# Patient Record
Sex: Female | Born: 1990 | Race: White | Hispanic: No | Marital: Single | Smoking: Current every day smoker
Health system: Southern US, Community
[De-identification: ages and names within clinical notes are randomized; demographics above are authoritative.]

## PROBLEM LIST (undated history)

## (undated) DIAGNOSIS — Q780 Osteogenesis imperfecta: Secondary | ICD-10-CM

## (undated) HISTORY — PX: MANDIBLE SURGERY: SHX707

## (undated) HISTORY — PX: ANKLE SURGERY: SHX546

---
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
## 2008-07-13 ENCOUNTER — Encounter: Payer: Self-pay | Admitting: *Deleted

## 2008-07-17 ENCOUNTER — Ambulatory Visit: Payer: Self-pay | Admitting: Family Medicine

## 2009-01-30 ENCOUNTER — Emergency Department (HOSPITAL_COMMUNITY): Admission: EM | Admit: 2009-01-30 | Discharge: 2009-01-31 | Payer: Self-pay | Admitting: Emergency Medicine

## 2010-01-02 ENCOUNTER — Emergency Department (HOSPITAL_COMMUNITY): Admission: EM | Admit: 2010-01-02 | Discharge: 2010-01-02 | Payer: Self-pay | Admitting: Emergency Medicine

## 2010-01-07 ENCOUNTER — Encounter: Payer: Self-pay | Admitting: Family Medicine

## 2010-01-07 ENCOUNTER — Ambulatory Visit: Payer: Self-pay | Admitting: Family Medicine

## 2010-01-07 LAB — CONVERTED CEMR LAB
TSH: 1.21 microintl units/mL (ref 0.350–4.500)
Vit D, 25-Hydroxy: 22 ng/mL — ABNORMAL LOW (ref 30–89)

## 2010-02-24 ENCOUNTER — Ambulatory Visit: Payer: Self-pay | Admitting: Family Medicine

## 2010-02-24 DIAGNOSIS — N926 Irregular menstruation, unspecified: Secondary | ICD-10-CM

## 2010-02-24 LAB — CONVERTED CEMR LAB: Beta hcg, urine, semiquantitative: NEGATIVE

## 2010-03-08 ENCOUNTER — Encounter: Payer: Self-pay | Admitting: Family Medicine

## 2010-03-08 LAB — CONVERTED CEMR LAB: Pap Smear: NEGATIVE

## 2010-05-19 ENCOUNTER — Ambulatory Visit: Payer: Self-pay | Admitting: Family Medicine

## 2010-08-12 ENCOUNTER — Emergency Department (HOSPITAL_COMMUNITY): Admission: EM | Admit: 2010-08-12 | Discharge: 2010-08-12 | Payer: Self-pay | Admitting: Family Medicine

## 2010-10-27 NOTE — Assessment & Plan Note (Signed)
Summary: cpe & preg test,df   Vital Signs:  Patient profile:   20 year old female Height:      65 inches Weight:      150 pounds BMI:     25.05 Temp:     97.6 degrees F oral Pulse rate:   76 / minute BP sitting:   114 / 76  (left arm) Cuff size:   regular  Vitals Entered By: Garen Grams LPN (February 24, 2129 8:46 AM) CC: cpe Is Patient Diabetic? No Pain Assessment Patient in pain? no        Primary Care Provider:  Bobby Rumpf  MD  CC:  cpe.  History of Present Illness: 1) Anxiety: Seen at [facility] on 01/07/10 w/ concerns about recurrence of MDD/anxiety. Diagnosed at 20 years old. Had been on Prozac in the past; was restarted on Prozac at last visit, but reports that she only took it for a month. Reports that she was having "paranoia" related to marijauna use - as soon as she stopped her marijuana use her symptoms of anxiety resolved, therefore she did not need the Prozac any more. Reports sleep has improved, she feels motivated, and overall mood is "great". Reports that she does not plan on using marijuana in the future. Denies SI/HI.   2) Tobacco use: Smokes 1/2 pack per day but wishes to quit. Denies chronic cough, dyspnea.   3) Irregular menses: History of irregular menses. LMP was 1.5 months ago (the longest she has gone without a period is three months. Periods last 7 to 14 days and vary in terms of heaviness. Has regular, unprotected intercourse in monogamous relationship. Would like pregnancy test today to ensure she is not pregnant. Reports some bleeding yesterday and some spotting today.   4) Birth control: Would like to restart birth control. Sexual intercourse as above. Considering Depo Provera (has been on this in the past - with some mild acne and weight gain) vs. OCPs. Current smoker.   5) Prevention: Pap today. Counseled on tobacco use.   Habits & Providers  Alcohol-Tobacco-Diet     Tobacco Status: current     Tobacco Counseling: to quit use of tobacco products  Cigarette Packs/Day: 0.5  Current Medications (verified): 1)  None  Allergies (verified): No Known Drug Allergies  Past History:  Past Surgical History: R ankle.   Social History: Lives with mom, dad, and brother.  Stopped marijauna about 1 month ago - no ther drug use. Occasional alcohol (4 drinks a month)  w/o binge drinking. Delivers newspapers. Monogamous relationship, unprotected intercourse. Packs/Day:  0.5  Review of Systems       as per HPI o/w negative for 10 systems   Physical Exam  General:  overweight, NAD, pleasant, vitals reviewed  Eyes:  pupils equal, round and reactive to light , extraoccular movements intact , fundi normal  Nose:  no congestion or rhinorrhea.  Mouth:  moist membranes, no erythema or exudate bilateral tonsillar hypertrophy w/o erythema or exudate noted.  Neck:  no lymphadenopathy   Lungs:  CTAB w/o wheeze or crackles  Heart:  RRR no murmurs .  Abdomen:  soft, non-tender, normal bowel sounds, no distention, no masses, and no guarding.   Genitalia:  Pelvic Exam:        External: normal female genitalia without lesions or masses        Vagina: normal without lesions or masses        Cervix: normal without lesions or masses  Adnexa: normal bimanual exam without masses or fullness        Uterus: normal by palpation        Pap smear: performed Msk:  normal ROM, no joint tenderness, no joint swelling, and no joint warmth. Pulses:  2+ radials  Extremities:  no edema  Neurologic:  alert & oriented X3, cranial nerves II-XII intact, strength normal in all extremities, sensation intact to light touch, and DTRs symmetrical and normal.   Skin:  mild facial acne    Impression & Recommendations:  Problem # 1:  Preventive Health Care (ICD-V70.0) Assessment Unchanged  Pap today. Counseled on tobacco use.   Orders: [facility] - Est  18-39 yrs ([address]109)  Problem # 2:  DEPRESSION (ICD-311) Assessment: Improved Advised to stop using marijauna as before  - patient agreeable to this plan. Does not wish to restart anti-depressants. Will follow up if symptoms return.  The following medications were removed from the medication list:    Fluoxetine Hcl 20 Mg Caps (Fluoxetine hcl) ..... One tablet by mouth daily  Problem # 3:  IRREGULAR MENSTRUAL CYCLE (ICD-626.4) Assessment: Unchanged Upreg negative. No change from prior cycles. Will follow for now. Advised diet and exercise for weight loss, may help regulate periods. Depo Provera as below may result in amenorrhea (advised patient as such).   Problem # 4:  CONTRACEPTIVE MANAGEMENT (ICD-V25.09) Assessment: Comment Only  Depo Provera. Will follow in three months for next shot.   Orders: U Preg-[facility] (60454) Depo-Provera 150mg  (J1055) Pap Smear-[facility] (09811-91478)  Complete Medication List: 1)  Oscal 500/200 D-3 500-200 Mg-unit Tabs (Calcium-vitamin d) .... One tab by mouth qday  Patient Instructions: 1)  Next Depo Provera shot due Aug 18th - Sept 1st  2)  Continue to work on quitting smoking. Have 10 cigarettes for three days, then 9 for three days, then 8 and so on until you are quit. CALL the quitline for help with quitting! 3)  I will let you know the results of your Pap.  4)  Start taking Oscal tabs (calcium and vitamin D). This will help build up your bones.  5)  Follow up in one year for physical and Pap.  Prescriptions: OSCAL 500/200 D-3 500-200 MG-UNIT TABS (CALCIUM-VITAMIN D) one tab by mouth qday  #30 x 12   Entered and Authorized by:   Bobby Rumpf  MD   Signed by:   Bobby Rumpf  MD on 02/24/2010   Method used:   [facility]  [address] [id number]       [address]       [place], [place]  [address]       Ph: [phone] or [phone]       Fax: [phone]   RxID:   [id number]   Laboratory Results   Urine Tests  Date/Time Received: February 24, 2010 8:40 AM  Date/Time Reported: February 24, 2010 8:49 AM     Urine HCG:  negative Comments: ...............test performed by......Marland KitchenBonnie A. [place], MLS (ASCP)cm       Medication Administration  Injection # 1:    Medication: Depo-Provera 150mg     Diagnosis: CONTRACEPTIVE MANAGEMENT (ICD-V25.09)    Route: IM    Site: L deltoid    Exp Date: 10/26/2012    Lot #: [id number]    Mfr: Francisca December    Comments: Next Depo Due: August 18 - September 1    Patient tolerated  injection without complications    Given by: Garen Grams LPN (February 25, 8468 9:36 AM)  Orders Added: 1)  U Preg-[facility] [81025] 2)  Depo-Provera 150mg  [J1055] 3)  Pap Smear-[facility] [62952-84132] 4)  [facility] - Est  18-39 yrs [99395]   Appended Document: depo provera     Clinical Lists Changes  Medications: Added new medication of DEPO-PROVERA 150 MG/ML SUSP (MEDROXYPROGESTERONE ACETATE) 150 mg IM q three months

## 2010-10-27 NOTE — Assessment & Plan Note (Signed)
Summary: anxiety,tcb   Vital Signs:  Patient profile:   20 year old female Height:      65 inches Weight:      150.9 pounds BMI:     25.20 Temp:     97.9 degrees F oral Pulse rate:   56 / minute BP sitting:   122 / 80  (left arm) Cuff size:   regular  Vitals Entered By: Garen Grams LPN (January 07, 2010 2:36 PM) CC: ? depression with persistant nausea Is Patient Diabetic? No Pain Assessment Patient in pain? no        Primary Care Provider:  Ruthe Mannan MD  CC:  ? depression with persistant nausea.  History of Present Illness: Patient here today for c/o persistent nausea and concerns about recurrence of MDD/anxiety.  Patient was seen at [facility] 5 days ago for nausea and vomiting.  Labs reveal elevated WBC 13.7 with left shift.  Otherwise normal, including neg preg test. Patient was give rx for antiemetic.  She states she also has runny stools x 1 week, nonwatery, nonbloody.  She has been unable to tolerate solids/liquids until today.  She was able to drink milk today and keep it down.    Regarding MDD and anxiety, she was diagnosed at 20 years old. She went to counseling and was started on Prozac. She continued her med for only 2 months, but reported an improvement in her symptoms.  She denies any SI/HI, but does say she has been thinking about dying (fear of it).  She states she is not sleeping, she lacks any motivation to do things she previously enjoyed.  She stays at home most days. She saw a psychologist today that gave her relaxation techniques.  Habits & Providers  Alcohol-Tobacco-Diet     Tobacco Status: current     Tobacco Counseling: to quit use of tobacco products     Cigarette Packs/Day: <0.25  Social History: Smoking Status:  current Packs/Day:  <0.25  Physical Exam  General:  Well-developed,well-nourished,in no acute distress; alert,appropriate and cooperative throughout examination Eyes:  No corneal or conjunctival inflammation noted. EOMI. Perrla. Funduscopic  exam benign, without hemorrhages, exudates or papilledema. Vision grossly normal. Ears:  External ear exam shows no significant lesions or deformities.  Otoscopic examination reveals clear canals, tympanic membranes are intact bilaterally without bulging, retraction, inflammation or discharge. Hearing is grossly normal bilaterally. Nose:  External nasal examination shows no deformity or inflammation. Nasal mucosa are pink and moist without lesions or exudates. Mouth:  Oral mucosa and oropharynx without lesions or exudates.  Teeth in good repair. Neck:  No deformities, masses, or tenderness noted. Lungs:  Normal respiratory effort, chest expands symmetrically. Lungs are clear to auscultation, no crackles or wheezes. Heart:  Normal rate and regular rhythm. S1 and S2 normal without gallop, murmur, click, rub or other extra sounds. Abdomen:  Bowel sounds positive,abdomen soft and non-tender without masses, organomegaly or hernias noted. Psych:  Cognition and judgment appear intact. Alert and cooperative with normal attention span and concentration. No apparent delusions, illusions, hallucinations. depressed affect and flat affect.     Impression & Recommendations:  Problem # 1:  GASTROENTERITIS, VIRAL (ICD-008.8)  Most likely viral gastroenteritis. Seems to be improving.  Will give rx for phenergan before meals.  Encouraged her to try to increase fluids. Advance diet as tolerated.  Orders: [facility]- Est Level  3 ([address]109)  Problem # 2:  DEPRESSION (ICD-311) Will restart Prozac and check labs. Follow up in 2 weeks to assess for  side effects and again in 4-6 weeks to assess efficacy. Patient encouraged to continue seeing her psychologist for counseling.  Her updated medication list for this problem includes:    Fluoxetine Hcl 20 Mg Caps (Fluoxetine hcl) ..... One tablet by mouth daily  Orders: TSH-[facility] ([address]-60454) Vit D, 25 OH-[facility] ([address]11-91478) [facility]- Est Level  3 (29562)  Complete Medication  List: 1)  Yaz 3-0.02 Mg Tabs (Drospirenone-ethinyl estradiol) .... Use as directed. dispense 1 pack. 2)  Promethazine Hcl 12.5 Mg Tabs (Promethazine hcl) .... One tab by mouth q4-6 hours as needed.  take 20-30 mins prior to meal 3)  Fluoxetine Hcl 20 Mg Caps (Fluoxetine hcl) .... One tablet by mouth daily  Other Orders: HIV-[facility] (13086-57846) Prescriptions: PROMETHAZINE HCL 12.5 MG TABS (PROMETHAZINE HCL) one tab by mouth q4-6 hours as needed.  Take 20-30 mins prior to meal  #30 x 0   Entered by:   Jettie Pagan MD   Authorized by:   Bobby Rumpf  MD   Signed by:   Jettie Pagan MD on 01/07/2010   Method used:   [facility]  [address] [id number]       [address]       [place], [place]  [address]       Ph: [phone] or [phone]       Fax: [phone]   RxID:   [id number] FLUOXETINE HCL 20 MG CAPS (FLUOXETINE HCL) one tablet by mouth daily  #30 x 5   Entered by:   Jettie Pagan MD   Authorized by:   Bobby Rumpf  MD   Signed by:   Jettie Pagan MD on 01/07/2010   Method used:   [facility]        [facility]  [address] [id number]       [address]       [place], [place]  [address]       Ph: [phone] or [phone]       Fax: [phone]   RxID:   [id number]

## 2010-10-27 NOTE — Letter (Signed)
Summary: Results Follow-up Letter  [facility]  [address]   [place], [place] [address]   Phone: [phone]  Fax: [phone]    03/08/2010  [address] [place], [place]  [address]  Dear Ms. Weigold,   The following are the results of your recent test(s):  Test     Result     Pap Smear    Normal  Follow up Pap Smear will be in one year.  Sincerely,  Bobby Rumpf  MD [facility]           Appended Document: Results Follow-up Letter mailed.

## 2010-10-27 NOTE — Assessment & Plan Note (Signed)
Summary: depo,df   Nurse Visit   Allergies: No Known Drug Allergies  Medication Administration  Injection # 1:    Medication: Depo-Provera 150mg     Diagnosis: CONTRACEPTIVE MANAGEMENT (ICD-V25.09)    Route: IM    Site: R deltoid    Exp Date: 11/2012    Lot #: [id number]    Mfr: [email or web address]    Comments: next dpeo due Nov 10 thru Nov 24 , 2011    Patient tolerated injection without complications    Given by: Theresia Lo RN (May 19, 2010 9:55 AM)  Orders Added: 1)  Depo-Provera 150mg  [J1055] 2)  Admin of Injection (IM/SQ) [04540]   Medication Administration  Injection # 1:    Medication: Depo-Provera 150mg     Diagnosis: CONTRACEPTIVE MANAGEMENT (ICD-V25.09)    Route: IM    Site: R deltoid    Exp Date: 11/2012    Lot #: [id number]    Mfr: [email or web address]    Comments: next dpeo due Nov 10 thru Nov 24 , 2011    Patient tolerated injection without complications    Given by: Theresia Lo RN (May 19, 2010 9:55 AM)  Orders Added: 1)  Depo-Provera 150mg  [J1055] 2)  Admin of Injection (IM/SQ) [47829]

## 2010-12-06 LAB — POCT URINALYSIS DIPSTICK
Bilirubin Urine: NEGATIVE
Glucose, UA: NEGATIVE mg/dL
Ketones, ur: NEGATIVE mg/dL
Nitrite: NEGATIVE
Protein, ur: NEGATIVE mg/dL
Specific Gravity, Urine: 1.02 (ref 1.005–1.030)
Urobilinogen, UA: 0.2 mg/dL (ref 0.0–1.0)
pH: 5.5 (ref 5.0–8.0)

## 2010-12-06 LAB — POCT I-STAT, CHEM 8
BUN: 9 mg/dL (ref 6–23)
Calcium, Ion: 1.18 mmol/L (ref 1.12–1.32)
Chloride: 108 mEq/L (ref 96–112)
Creatinine, Ser: 0.7 mg/dL (ref 0.4–1.2)
Glucose, Bld: 92 mg/dL (ref 70–99)
HCT: 50 % — ABNORMAL HIGH (ref 36.0–46.0)
Hemoglobin: 17 g/dL — ABNORMAL HIGH (ref 12.0–15.0)
Potassium: 4.5 mEq/L (ref 3.5–5.1)
Sodium: 141 mEq/L (ref 135–145)
TCO2: 23 mmol/L (ref 0–100)

## 2010-12-06 LAB — POCT H PYLORI SCREEN: H. PYLORI SCREEN, POC: NEGATIVE

## 2010-12-06 LAB — POCT PREGNANCY, URINE: Preg Test, Ur: NEGATIVE

## 2010-12-14 LAB — DIFFERENTIAL
Basophils Absolute: 0.1 10*3/uL (ref 0.0–0.1)
Basophils Relative: 0 % (ref 0–1)
Eosinophils Absolute: 0 10*3/uL (ref 0.0–0.7)
Eosinophils Relative: 0 % (ref 0–5)
Lymphocytes Relative: 9 % — ABNORMAL LOW (ref 12–46)
Lymphs Abs: 1.3 10*3/uL (ref 0.7–4.0)
Monocytes Absolute: 0.5 10*3/uL (ref 0.1–1.0)
Monocytes Relative: 4 % (ref 3–12)
Neutro Abs: 11.8 10*3/uL — ABNORMAL HIGH (ref 1.7–7.7)
Neutrophils Relative %: 86 % — ABNORMAL HIGH (ref 43–77)

## 2010-12-14 LAB — COMPREHENSIVE METABOLIC PANEL
ALT: 26 U/L (ref 0–35)
AST: 21 U/L (ref 0–37)
Albumin: 4.5 g/dL (ref 3.5–5.2)
Alkaline Phosphatase: 93 U/L (ref 39–117)
BUN: 9 mg/dL (ref 6–23)
CO2: 29 mEq/L (ref 19–32)
Calcium: 9.6 mg/dL (ref 8.4–10.5)
Chloride: 108 mEq/L (ref 96–112)
Creatinine, Ser: 0.73 mg/dL (ref 0.4–1.2)
GFR calc Af Amer: >60 mL/min (ref >60)
GFR calc non Af Amer: >60 mL/min (ref >60)
Glucose, Bld: 114 mg/dL — ABNORMAL HIGH (ref 70–99)
Potassium: 4.5 mEq/L (ref 3.5–5.1)
Sodium: 141 mEq/L (ref 135–145)
Total Bilirubin: 0.5 mg/dL (ref 0.3–1.2)
Total Protein: 7.7 g/dL (ref 6.0–8.3)

## 2010-12-14 LAB — URINALYSIS, ROUTINE W REFLEX MICROSCOPIC
Bilirubin Urine: NEGATIVE
Glucose, UA: NEGATIVE mg/dL
Hgb urine dipstick: NEGATIVE
Ketones, ur: NEGATIVE mg/dL
Nitrite: NEGATIVE
Protein, ur: NEGATIVE mg/dL
Specific Gravity, Urine: 1.014 (ref 1.005–1.030)
Urobilinogen, UA: 0.2 mg/dL (ref 0.0–1.0)
pH: 8 (ref 5.0–8.0)

## 2010-12-14 LAB — URINE MICROSCOPIC-ADD ON

## 2010-12-14 LAB — CBC
HCT: 44.1 % (ref 36.0–46.0)
Hemoglobin: 15.1 g/dL — ABNORMAL HIGH (ref 12.0–15.0)
MCHC: 34.1 g/dL (ref 30.0–36.0)
MCV: 90 fL (ref 78.0–100.0)
Platelets: 276 10*3/uL (ref 150–400)
RBC: 4.9 MIL/uL (ref 3.87–5.11)
RDW: 13 % (ref 11.5–15.5)
WBC: 13.7 10*3/uL — ABNORMAL HIGH (ref 4.0–10.5)

## 2010-12-14 LAB — POCT PREGNANCY, URINE: Preg Test, Ur: NEGATIVE

## 2011-01-03 LAB — URINALYSIS, ROUTINE W REFLEX MICROSCOPIC
Bilirubin Urine: NEGATIVE
Glucose, UA: NEGATIVE mg/dL
Ketones, ur: 15 mg/dL — AB
Leukocytes, UA: NEGATIVE
Nitrite: NEGATIVE
Protein, ur: 30 mg/dL — AB
Specific Gravity, Urine: 1.018 (ref 1.005–1.030)
Urobilinogen, UA: 0.2 mg/dL (ref 0.0–1.0)
pH: 6 (ref 5.0–8.0)

## 2011-01-03 LAB — URINE MICROSCOPIC-ADD ON

## 2011-01-03 LAB — GLUCOSE, CAPILLARY: Glucose-Capillary: 88 mg/dL (ref 70–99)

## 2011-01-03 LAB — POCT PREGNANCY, URINE: Preg Test, Ur: NEGATIVE

## 2011-02-10 NOTE — Op Note (Signed)
[name], Alicia                 ACCOUNT NO.:  [id number]   MEDICAL RECORD NO.:  [id number]          PATIENT TYPE:  INP   LOCATION:  0445                         FACILITY:  [facility]   PHYSICIAN:  Thera Flake., M.D.DATE OF BIRTH:  [birth year]   DATE OF PROCEDURE:  12/31/2004  DATE OF DISCHARGE:                                 OPERATIVE REPORT   PREOPERATIVE DIAGNOSIS:  Fracture dislocation of right ankle.   POSTOPERATIVE DIAGNOSIS:  Fracture dislocation of right ankle.   OPERATION:  1.  Closed reduction under anesthesia.  2.  Open reduction, internal fixation right bimalleolar ankle fracture (7      hole Synthes semitubular plate, one 4.5 cannulated screw).   SURGEON:  Dr. Madelon Lips   ASSISTANT:  Duffy, PA-C.   TOURNIQUET TIME:  Approximately 1 hour.   DESCRIPTION OF PROCEDURE:  After provisional reduction with reduction of the  posterior displacement and lateral displacement and sterile prep and drape,  exsanguination of the leg and placed in the tourniquet to 350, lateral  incision was made.  An oblique fracture of the fibula identified about 3 cm  above the mortis.  It was reduced provisionally, fixed with a 7 hole plate,  although it was elected to only use 6 out of the 7 holes.  Three holes were  obtained with distal fixation, 3 holes proximal.  Excellent reduction  obtained, confirmed to be well reduced in the AP and mortis and lateral  views.  Plate was placed more on a posterolateral position to provide a  derotational effect on the fracture.  Attention was next directed to the  medial malleolus which was approached through a curvilinear incision.  A  relatively small fragment was identified.  Excellent purchase was obtained  with one 4.5 mm cannulated screw.  Irrigation was carried out, closed with 0  and 2-0 Vicryl, staples on the skin, Marcaine without epinephrine 0.5% in  the skin, compressive sterile dressing and a posterior splint applied, taken  to the recovery  room in stable condition.  Tourniquet was released after  application of the dressing before application of the splint.      WDC/MEDQ  D:  12/31/2004  T:  12/31/2004  Job:  [id number]

## 2011-10-13 ENCOUNTER — Emergency Department (HOSPITAL_COMMUNITY)
Admission: EM | Admit: 2011-10-13 | Discharge: 2011-10-13 | Disposition: A | Discharge status: Home / Self Care | Payer: Self-pay | Attending: Emergency Medicine | Admitting: Emergency Medicine

## 2011-10-13 ENCOUNTER — Encounter (HOSPITAL_COMMUNITY): Payer: Self-pay | Admitting: *Deleted

## 2011-10-13 DIAGNOSIS — R509 Fever, unspecified: Secondary | ICD-10-CM | POA: Insufficient documentation

## 2011-10-13 DIAGNOSIS — IMO0001 Reserved for inherently not codable concepts without codable children: Secondary | ICD-10-CM | POA: Insufficient documentation

## 2011-10-13 DIAGNOSIS — R51 Headache: Secondary | ICD-10-CM | POA: Insufficient documentation

## 2011-10-13 DIAGNOSIS — J111 Influenza due to unidentified influenza virus with other respiratory manifestations: Secondary | ICD-10-CM | POA: Insufficient documentation

## 2011-10-13 DIAGNOSIS — R059 Cough, unspecified: Secondary | ICD-10-CM | POA: Insufficient documentation

## 2011-10-13 DIAGNOSIS — R070 Pain in throat: Secondary | ICD-10-CM | POA: Insufficient documentation

## 2011-10-13 DIAGNOSIS — R05 Cough: Secondary | ICD-10-CM | POA: Insufficient documentation

## 2011-10-13 MED ORDER — IBUPROFEN 800 MG PO TABS
800.0000 mg | ORAL_TABLET | Freq: Once | ORAL | Status: AC
  Administered 2011-10-13: 800 mg via ORAL
  Filled 2011-10-13: qty 1

## 2011-10-13 NOTE — ED Notes (Signed)
Pt st's she checked her temp at 5:30pm today and it was 102 so she came to the ED.  Has not taken anything for fever.  Has been taking Nyquil Day.

## 2011-10-13 NOTE — ED Provider Notes (Signed)
Medical screening examination/treatment/procedure(s) were performed by non-physician practitioner and as supervising physician I was immediately available for consultation/collaboration.   Dione Booze, MD 10/13/11 [phone]

## 2011-10-13 NOTE — ED Notes (Signed)
Coughing since  Wednesday with a low  Grade temp with body aches and a headache

## 2011-10-13 NOTE — ED Provider Notes (Signed)
History     CSN: [id number]  Arrival date & time 10/13/11  1810   First MD Initiated Contact with Patient 10/13/11 2006      Chief Complaint  Patient presents with  . Fever    (Consider location/radiation/quality/duration/timing/severity/associated sxs/prior treatment) HPI Comments: The patient has been having myalgias, headache, fever, cough, sore throat for the past 3 days has been taking DayQuil and NyQuil with some relief of her headache, fevers intermittently taking Tylenol or ibuprofen.  Has not gotten the flu shot this year  Patient is a 21 y.o. female presenting with fever. The history is provided by the patient.  Fever Primary symptoms of the febrile illness include fever, headaches, cough and myalgias. Primary symptoms do not include shortness of breath, abdominal pain, nausea or dysuria. The current episode started 3 to 5 days ago. This is a new problem. The problem has not changed since onset. The fever began 3 to 5 days ago. The fever has been unchanged since its onset. The maximum temperature recorded prior to her arrival was unknown.  The headache began more than 2 days ago. Headache is a new problem. The pain from the headache is at a severity of 5/10. The headache is not associated with photophobia, double vision or eye pain.  The cough began 3 to 5 days ago. The cough is non-productive.    History reviewed. No pertinent past medical history.  History reviewed. No pertinent past surgical history.  History reviewed. No pertinent family history.  History  Substance Use Topics  . Smoking status: Current Everyday Smoker  . Smokeless tobacco: Not on file  . Alcohol Use: No    OB History    Grav Para Term Preterm Abortions TAB SAB Ect Mult Living                  Review of Systems  Constitutional: Positive for fever and chills.  HENT: Positive for sore throat.   Eyes: Negative for double vision, photophobia and pain.  Respiratory: Positive for cough.  Negative for shortness of breath.   Gastrointestinal: Negative for nausea and abdominal pain.  Genitourinary: Negative for dysuria.  Musculoskeletal: Positive for myalgias.  Neurological: Positive for headaches.    Allergies  Review of patient's allergies indicates no known allergies.  Home Medications   Current Outpatient Rx  Name Route Sig Dispense Refill  . PSEUDOEPHEDRINE-APAP-DM 40-981-19 MG/30ML PO LIQD Oral Take 30 mLs by mouth every 6 (six) hours as needed. For cold symptoms      BP 125/74  Pulse 114  Temp(Src) 101 F (38.3 C) (Oral)  Resp 16  SpO2 98%  LMP 10/13/2011  Physical Exam  Constitutional: She is oriented to person, place, and time. She appears well-developed and well-nourished.  HENT:  Head: Normocephalic.  Eyes: Pupils are equal, round, and reactive to light.  Neck: Normal range of motion.  Cardiovascular: Normal rate.   Pulmonary/Chest: Effort normal.  Abdominal: Soft.  Musculoskeletal: Normal range of motion.  Neurological: She is alert and oriented to person, place, and time.  Skin: Skin is warm. No rash noted.  Psychiatric: She has a normal mood and affect.    ED Course  Procedures (including critical care time)  Labs Reviewed - No data to display No results found.   1. Influenza     Flulike illness  MDM  Influenza        Arman Filter, NP 10/13/11 2030  Arman Filter, NP 10/13/11 2030

## 2012-11-08 ENCOUNTER — Encounter (HOSPITAL_COMMUNITY): Payer: Self-pay | Admitting: Emergency Medicine

## 2012-11-08 ENCOUNTER — Emergency Department (HOSPITAL_COMMUNITY)
Admission: EM | Admit: 2012-11-08 | Discharge: 2012-11-09 | Disposition: A | Discharge status: Home / Self Care | Payer: Self-pay | Attending: Emergency Medicine | Admitting: Emergency Medicine

## 2012-11-08 DIAGNOSIS — Z3202 Encounter for pregnancy test, result negative: Secondary | ICD-10-CM | POA: Insufficient documentation

## 2012-11-08 DIAGNOSIS — F17200 Nicotine dependence, unspecified, uncomplicated: Secondary | ICD-10-CM | POA: Insufficient documentation

## 2012-11-08 DIAGNOSIS — R109 Unspecified abdominal pain: Secondary | ICD-10-CM

## 2012-11-08 DIAGNOSIS — R110 Nausea: Secondary | ICD-10-CM | POA: Insufficient documentation

## 2012-11-08 DIAGNOSIS — Q780 Osteogenesis imperfecta: Secondary | ICD-10-CM | POA: Insufficient documentation

## 2012-11-08 DIAGNOSIS — R1011 Right upper quadrant pain: Secondary | ICD-10-CM | POA: Insufficient documentation

## 2012-11-08 HISTORY — DX: Osteogenesis imperfecta: Q78.0

## 2012-11-08 LAB — COMPREHENSIVE METABOLIC PANEL
ALT: 25 U/L (ref 0–35)
Alkaline Phosphatase: 79 U/L (ref 39–117)
BUN: 11 mg/dL (ref 6–23)
CO2: 24 mEq/L (ref 19–32)
Chloride: 103 mEq/L (ref 96–112)
GFR calc Af Amer: >90 mL/min (ref >90)
GFR calc non Af Amer: >90 mL/min (ref >90)
Glucose, Bld: 97 mg/dL (ref 70–99)
Potassium: 3.9 mEq/L (ref 3.5–5.1)
Sodium: 138 mEq/L (ref 135–145)
Total Bilirubin: 0.2 mg/dL — ABNORMAL LOW (ref 0.3–1.2)
Total Protein: 7.8 g/dL (ref 6.0–8.3)

## 2012-11-08 LAB — CBC WITH DIFFERENTIAL/PLATELET
Hemoglobin: 16.2 g/dL — ABNORMAL HIGH (ref 12.0–15.0)
Lymphocytes Relative: 27 % (ref 12–46)
Lymphs Abs: 2 10*3/uL (ref 0.7–4.0)
Monocytes Relative: 7 % (ref 3–12)
Neutro Abs: 4.8 10*3/uL (ref 1.7–7.7)
Neutrophils Relative %: 63 % (ref 43–77)
Platelets: 297 10*3/uL (ref 150–400)
RBC: 5.25 MIL/uL — ABNORMAL HIGH (ref 3.87–5.11)
WBC: 7.5 10*3/uL (ref 4.0–10.5)

## 2012-11-08 MED ORDER — IBUPROFEN 800 MG PO TABS
800.0000 mg | ORAL_TABLET | Freq: Once | ORAL | Status: AC
  Administered 2012-11-08: 800 mg via ORAL
  Filled 2012-11-08: qty 1

## 2012-11-08 NOTE — ED Notes (Signed)
Pt c/o RUQ pain onset 12 today nausea onset 1 hour ago. PWD

## 2012-11-08 NOTE — ED Provider Notes (Signed)
History     CSN: [id number]  Arrival date & time 11/08/12  2111   First MD Initiated Contact with Patient 11/08/12 2322      Chief Complaint  Patient presents with  . Abdominal Pain    (Consider location/radiation/quality/duration/timing/severity/associated sxs/prior treatment) HPI Comments:  22 year old female presents emergency department complaining of gradual onset right upper quadrant pain beginning around 12:00 noon today. Prior to pain onset, patient states she ate a bacon and cheese sandwich. Initially she felt fine. Pain has been constant, nonradiating, rated 5/10, worse with deep inspiration or certain movements such as laying down. States she was able to eat a steak for dinner tonight, however became nauseated a little bit after. Denies vomiting. Denies history of any abdominal surgery or problem. No family history of gallbladder disease. Denies fever, chills, diarrhea, urinary or menstrual problems. She does admit to eating a very fatty diet.  Patient is a 22 y.o. female presenting with abdominal pain. The history is provided by the patient.  Abdominal Pain Associated symptoms: nausea   Associated symptoms: no chest pain, no chills, no diarrhea, no dysuria, no fever, no shortness of breath, no vaginal bleeding, no vaginal discharge and no vomiting     Past Medical History  Diagnosis Date  . OI (osteogenesis imperfecta)     Past Surgical History  Procedure Laterality Date  . Ankle surgery      RIGHT  . Mandible surgery      No family history on file.  History  Substance Use Topics  . Smoking status: Current Every Day Smoker  . Smokeless tobacco: Not on file  . Alcohol Use: Yes     Comment: occ    OB History   Grav Para Term Preterm Abortions TAB SAB Ect Mult Living                  Review of Systems  Constitutional: Negative for fever, chills and appetite change.  Respiratory: Negative for shortness of breath.   Cardiovascular: Negative for chest pain.   Gastrointestinal: Positive for nausea and abdominal pain. Negative for vomiting and diarrhea.  Genitourinary: Negative for dysuria, frequency, vaginal bleeding, vaginal discharge, menstrual problem and pelvic pain.  Musculoskeletal: Negative for back pain.  All other systems reviewed and are negative.    Allergies  Review of patient's allergies indicates no known allergies.  Home Medications  No current outpatient prescriptions on file.  BP 116/65  Pulse 76  Temp(Src) 98.3 F (36.8 C) (Oral)  Resp 20  Ht 5\' 6"  (1.676 m)  Wt 145 lb (65.772 kg)  BMI 23.41 kg/m2  SpO2 100%  LMP 09/25/2012  Physical Exam  Nursing note and vitals reviewed. Constitutional: She is oriented to person, place, and time. She appears well-developed and well-nourished. No distress.  HENT:  Head: Normocephalic and atraumatic.  Mouth/Throat: Oropharynx is clear and moist.  Eyes: Conjunctivae are normal.  Neck: Normal range of motion. Neck supple.  Cardiovascular: Normal rate, regular rhythm and normal heart sounds.   Pulmonary/Chest: Effort normal and breath sounds normal. No respiratory distress. She has no wheezes.  Abdominal: Soft. Normal appearance and bowel sounds are normal. She exhibits no distension and no mass. There is tenderness in the right upper quadrant. There is positive Murphy's sign. There is no rigidity, no guarding, no CVA tenderness and no tenderness at McBurney's point.  Pain pronounced in RUQ with palpation of the rest of abdomen.  Musculoskeletal: Normal range of motion. She exhibits no edema.  Neurological: She  is alert and oriented to person, place, and time.  Skin: Skin is warm and dry.  Psychiatric: She has a normal mood and affect. Her behavior is normal.    ED Course  Procedures (including critical care time)  Labs Reviewed  CBC WITH DIFFERENTIAL - Abnormal; Notable for the following:    RBC 5.25 (*)    Hemoglobin 16.2 (*)    HCT 46.2 (*)    All other components  within normal limits  COMPREHENSIVE METABOLIC PANEL - Abnormal; Notable for the following:    Total Bilirubin 0.2 (*)    All other components within normal limits  URINALYSIS, ROUTINE W REFLEX MICROSCOPIC - Abnormal; Notable for the following:    APPearance TURBID (*)    All other components within normal limits  URINE MICROSCOPIC-ADD ON - Abnormal; Notable for the following:    Squamous Epithelial / LPF FEW (*)    All other components within normal limits  LIPASE, BLOOD  POCT PREGNANCY, URINE   US Abdomen Complete  11/09/2012  *RADIOLOGY REPORT*  Clinical Data:  Right upper quadrant and epigastric abdominal pain.  ABDOMINAL ULTRASOUND COMPLETE  Comparison:  None  Findings:  Gallbladder:  The gallbladder is normal in appearance, without evidence for gallstones, gallbladder wall thickening or pericholecystic fluid.  No ultrasonographic Murphy's sign is elicited.  Common Bile Duct:  0.4 cm in diameter; within normal limits in caliber.  Liver:  Normal parenchymal echogenicity and echotexture; no focal lesions identified.  Limited Doppler evaluation demonstrates normal blood flow within the liver.  IVC:  Unremarkable in appearance.  Pancreas:  Although the pancreas is difficult to visualize in its entirety due to overlying bowel gas, no focal pancreatic abnormality is identified.  Spleen:  13.0 cm in length; within normal limits in size and echotexture.  Right kidney:  10.3 cm in length; normal in size, configuration and parenchymal echogenicity.  No evidence of mass or hydronephrosis.  Left kidney:  11.1 cm in length; normal in size, configuration and parenchymal echogenicity.  No evidence of mass or hydronephrosis.  Abdominal Aorta:  Normal in caliber; no aneurysm identified.  IMPRESSION: Unremarkable abdominal ultrasound.   Original Report Authenticated By: Tonia Ghent, M.D.      1. Abdominal pain       MDM  RUQ abdominal pain after eating bacon, egg and cheest sandwich followed by steak.  Patient also states she ate a lot of [employer] pizza yesterday. Labs unremarkable. Abdominal US unremarkable. Patient states improvement with ibuprofen. Tenderness to palpation on exam still present, however there is clinical improvement. Advised her to eat a bland diet and avoid fatty foods. She does not have a PCP. Resource list given for follow up. Return precautions discussed. Patient states understanding of plan and is agreeable.         Trevor Mace, PA-C 11/09/12 [phone]

## 2012-11-09 ENCOUNTER — Emergency Department (HOSPITAL_COMMUNITY): Payer: Self-pay

## 2012-11-09 LAB — URINALYSIS, ROUTINE W REFLEX MICROSCOPIC
Bilirubin Urine: NEGATIVE
Hgb urine dipstick: NEGATIVE
Ketones, ur: NEGATIVE mg/dL
Specific Gravity, Urine: 1.024 (ref 1.005–1.030)
Urobilinogen, UA: 0.2 mg/dL (ref 0.0–1.0)

## 2012-11-09 LAB — URINE MICROSCOPIC-ADD ON

## 2012-11-09 NOTE — ED Provider Notes (Signed)
Medical screening examination/treatment/procedure(s) were performed by non-physician practitioner and as supervising physician I was immediately available for consultation/collaboration.   Lyanne Co, MD 11/09/12 [phone]

## 2012-11-09 NOTE — ED Notes (Signed)
Patient transported to Ultrasound 

## 2016-05-04 ENCOUNTER — Encounter: Payer: Self-pay | Admitting: Physician Assistant

## 2016-05-04 ENCOUNTER — Ambulatory Visit (INDEPENDENT_AMBULATORY_CARE_PROVIDER_SITE_OTHER): Payer: Self-pay | Admitting: Physician Assistant

## 2016-05-04 VITALS — BP 147/82 | HR 99 | Ht 66.0 in | Wt 147.0 lb

## 2016-05-04 DIAGNOSIS — R4586 Emotional lability: Secondary | ICD-10-CM

## 2016-05-04 DIAGNOSIS — F39 Unspecified mood [affective] disorder: Secondary | ICD-10-CM

## 2016-05-04 DIAGNOSIS — R1032 Left lower quadrant pain: Secondary | ICD-10-CM

## 2016-05-04 DIAGNOSIS — D582 Other hemoglobinopathies: Secondary | ICD-10-CM

## 2016-05-04 DIAGNOSIS — N926 Irregular menstruation, unspecified: Secondary | ICD-10-CM

## 2016-05-04 DIAGNOSIS — R1031 Right lower quadrant pain: Secondary | ICD-10-CM

## 2016-05-04 MED ORDER — FLUOXETINE HCL 20 MG PO TABS: 20.0000 mg | ORAL_TABLET | Freq: Every day | ORAL | 1 refills | Status: DC

## 2016-05-05 ENCOUNTER — Encounter: Payer: Self-pay | Admitting: Physician Assistant

## 2016-05-05 DIAGNOSIS — R1031 Right lower quadrant pain: Secondary | ICD-10-CM | POA: Insufficient documentation

## 2016-05-05 DIAGNOSIS — R4586 Emotional lability: Secondary | ICD-10-CM | POA: Insufficient documentation

## 2016-05-05 DIAGNOSIS — D582 Other hemoglobinopathies: Secondary | ICD-10-CM | POA: Insufficient documentation

## 2016-05-05 DIAGNOSIS — R1032 Left lower quadrant pain: Principal | ICD-10-CM

## 2016-05-05 NOTE — Progress Notes (Signed)
   Subjective:    Patient ID: Alicia Williams, female    DOB: [birth year], 25 y.o.   MRN: [id number]  HPI Pt is a 25 yo female who presents to the clinic to establish care.   .. Active Ambulatory Problems    Diagnosis Date Noted  . TOBACCO ABUSE 02/08/2007  . DEPRESSION 03/27/2007  . LUMP OR MASS IN BREAST 03/27/2007  . IRREGULAR MENSTRUAL CYCLE 02/24/2010   Resolved Ambulatory Problems    Diagnosis Date Noted  . No Resolved Ambulatory Problems   Past Medical History:  Diagnosis Date  . OI (osteogenesis imperfecta)    .Marland Kitchen. Social History   Social History  . Marital status: Single    Spouse name: N/A  . Number of children: N/A  . Years of education: N/A   Occupational History  . Not on file.   Social History Main Topics  . Smoking status: Current Every Day Smoker  . Smokeless tobacco: Never Used  . Alcohol use No     Comment: occ  . Drug use: No  . Sexual activity: Yes   Other Topics Concern  . Not on file   Social History Narrative  . No narrative on file   . Family History  Problem Relation Age of Onset  . Cancer Mother   . Cancer Father   . Hypertension Father   . Cancer Brother   . Cancer Paternal Grandmother   . Heart attack Paternal Grandmother   . Hypertension Paternal Grandmother   . Stroke Paternal Grandmother   . Hypertension Paternal Grandfather    She comes in today because of some recent menstrual changes and depression. About 3 months ago 1-2 weeks before her period she started having severe agitations and PMS symptoms. This is unusal for her. Her periods are irrigular and heavy normally but not painful. Last few times her periods have been very crampy. She was on prozac when she was younger but has not been on since. She went to GYN and they put her on depo provera. Shot was given a few weeks ago. She is tender over her lower abdomen. Denies any bowel changes.    Review of Systems  HENT: Negative.   Neurological: Negative.   Hematological:  Negative.       Objective:   Physical Exam  Constitutional: She is oriented to person, place, and time. She appears well-developed and well-nourished.  HENT:  Head: Normocephalic and atraumatic.  Cardiovascular: Normal rate, regular rhythm and normal heart sounds.   Pulmonary/Chest: Effort normal and breath sounds normal.  Abdominal: Soft. Bowel sounds are normal. She exhibits no distension. There is tenderness. There is no rebound and no guarding.  Bilateral mild to moderate tenderness in the lower jaw in all quadrants to deep palpation. More noticeable on the right than left. No guarding or rebound.  Neurological: She is alert and oriented to person, place, and time.  Psychiatric: She has a normal mood and affect. Her behavior is normal.          Assessment & Plan:  Bilateral lower abdominal pain/irregular periods/menstrual cycle-  Unclear etiology at this time. Will order pelvic ultrasound as well as transvaginal ultrasound to evaluate. Ordering hormonal labs to look at possibility of PCOS.   Mood changes- PHQ 9 was 11. GAD-7 was 5. Certainly hormone could cause some mood changes. Hx of depression. I would like to restart prozac. Discussed side effects  Elevated HgB- will recheck today. Likely due to smoking.

## 2016-05-10 ENCOUNTER — Ambulatory Visit (INDEPENDENT_AMBULATORY_CARE_PROVIDER_SITE_OTHER): Payer: Self-pay | Admitting: Physician Assistant

## 2016-05-10 ENCOUNTER — Ambulatory Visit (INDEPENDENT_AMBULATORY_CARE_PROVIDER_SITE_OTHER): Payer: Self-pay

## 2016-05-10 ENCOUNTER — Encounter: Payer: Self-pay | Admitting: Physician Assistant

## 2016-05-10 VITALS — BP 117/66 | HR 98 | Ht 66.0 in | Wt 144.0 lb

## 2016-05-10 DIAGNOSIS — N888 Other specified noninflammatory disorders of cervix uteri: Secondary | ICD-10-CM

## 2016-05-10 DIAGNOSIS — Z720 Tobacco use: Secondary | ICD-10-CM

## 2016-05-10 DIAGNOSIS — F39 Unspecified mood [affective] disorder: Secondary | ICD-10-CM

## 2016-05-10 DIAGNOSIS — E282 Polycystic ovarian syndrome: Secondary | ICD-10-CM

## 2016-05-10 DIAGNOSIS — R4586 Emotional lability: Secondary | ICD-10-CM

## 2016-05-10 DIAGNOSIS — N3001 Acute cystitis with hematuria: Secondary | ICD-10-CM

## 2016-05-10 DIAGNOSIS — F17200 Nicotine dependence, unspecified, uncomplicated: Secondary | ICD-10-CM

## 2016-05-10 MED ORDER — NORETHIN ACE-ETH ESTRAD-FE 1-20 MG-MCG PO TABS: 1.0000 | ORAL_TABLET | Freq: Every day | ORAL | 11 refills | Status: DC

## 2016-05-10 NOTE — Patient Instructions (Signed)
Polycystic Ovarian Syndrome  Polycystic ovarian syndrome (PCOS) is a common hormonal disorder among women of reproductive age. Most women with PCOS grow many small cysts on their ovaries. PCOS can cause problems with your periods and make it difficult to get pregnant. It can also cause an increased risk of miscarriage with pregnancy. If left untreated, PCOS can lead to serious health problems, such as diabetes and heart disease.  CAUSES  The cause of PCOS is not fully understood, but genetics may be a factor.  SIGNS AND SYMPTOMS   · Infrequent or no menstrual periods.    · Inability to get pregnant (infertility) because of not ovulating.    · Increased growth of hair on the face, chest, stomach, back, thumbs, thighs, or toes.    · Acne, oily skin, or dandruff.    · Pelvic pain.    · Weight gain or obesity, usually carrying extra weight around the waist.    · Type 2 diabetes.     · High cholesterol.    · High blood pressure.    · Female-pattern baldness or thinning hair.    · Patches of thickened and dark brown or black skin on the neck, arms, breasts, or thighs.    · Tiny excess flaps of skin (skin tags) in the armpits or neck area.    · Excessive snoring and having breathing stop at times while asleep (sleep apnea).    · Deepening of the voice.    · Gestational diabetes when pregnant.    DIAGNOSIS   There is no single test to diagnose PCOS.   · Your health care provider will:      Take a medical history.      Perform a pelvic exam.      Have ultrasonography done.      Check your female and female hormone levels.      Measure glucose or sugar levels in the blood.      Do other blood tests.    · If you are producing too many female hormones, your health care provider will make sure it is from PCOS. At the physical exam, your health care provider will want to evaluate the areas of increased hair growth. Try to allow natural hair growth for a few days before the visit.    · During a pelvic exam, the ovaries may be enlarged  or swollen because of the increased number of small cysts. This can be seen more easily by using vaginal ultrasonography or screening to examine the ovaries and lining of the uterus (endometrium) for cysts. The uterine lining may become thicker if you have not been having a regular period.    TREATMENT   Because there is no cure for PCOS, it needs to be managed to prevent problems. Treatments are based on your symptoms. Treatment is also based on whether you want to have a baby or whether you need contraception.   Treatment may include:   · Progesterone hormone to start a menstrual period.    · Birth control pills to make you have regular menstrual periods.    · Medicines to make you ovulate, if you want to get pregnant.    · Medicines to control your insulin.    · Medicine to control your blood pressure.    · Medicine and diet to control your high cholesterol and triglycerides in your blood.  · Medicine to reduce excessive hair growth.   · Surgery, making small holes in the ovary, to decrease the amount of female hormone production. This is done through a long, lighted tube (laparoscope) placed into the pelvis through a tiny incision in the lower abdomen.      HOME CARE INSTRUCTIONS  · Only take over-the-counter or prescription medicine as directed by your health care provider.  · Pay attention to the foods you eat and your activity levels. This can help reduce the effects of PCOS.    Keep your weight under control.    Eat foods that are low in carbohydrate and high in fiber.    Exercise regularly.  SEEK MEDICAL CARE IF:  · Your symptoms do not get better with medicine.  · You have new symptoms.     This information is not intended to replace advice given to you by your health care provider. Make sure you discuss any questions you have with your health care provider.     Document Released: 01/05/2005 Document Revised: 07/02/2013 Document Reviewed: 02/27/2013  Elsevier Interactive Patient Education ©2016 [employer]

## 2016-05-10 NOTE — Progress Notes (Signed)
   Subjective:    Patient ID: Alicia Williams, female    DOB: 1990/11/23, 25 y.o.   MRN: [id number]  HPI Patient is a 25 year old female who presents to the clinic to follow-up after hospital visit for urinary tract infection. She was given Bactrim. She is on her third day of Bactrim. She went to the emergency room after 2 days of nausea vomiting. She thought it was from starting the Prozac. Urine dipstick confirmed infection. She stopped Prozac and completing Bactrim. She feels much better. She denies any dysuria, back pain, lower abdominal pain, fever or chills. She is not nauseated.   Review of Systems  All other systems reviewed and are negative.      Objective:   Physical Exam  Constitutional: She is oriented to person, place, and time. She appears well-developed and well-nourished.  HENT:  Head: Normocephalic and atraumatic.  Cardiovascular: Normal rate, regular rhythm and normal heart sounds.   Pulmonary/Chest: Effort normal and breath sounds normal.  No CVA tenderness.  Abdominal: Soft. Bowel sounds are normal. She exhibits no distension and no mass. There is no tenderness. There is no rebound and no guarding.  Neurological: She is alert and oriented to person, place, and time.  Psychiatric: She has a normal mood and affect. Her behavior is normal.          Assessment & Plan:  UTI with hematuria- finish bactrim. If still having symptoms come in for urine dipstick recheck. I do think bilateral lower abdominal pain was likely UTI symptoms. Resolved today.   PCOS- discussed diseaseAnd how this would be a contributing factor to her irregular periods. HO given. Discussed birth control pills to start after Depo shot runs out on September 19. She is in agreement with this. junel printed to start. Patient is a smoker. She was encouraged to stop smoking. She is aware that smoking increases her risk of drug and blood clots. She agrees to cut back. Patient is under 35 therefore we can go  ahead and give her birth control. Discussed metformin to help with insulin resistance. Patient declined today. Waiting for the rest of blood work to further make diagnosis of PCO S.  Mood changes- we'll hold off on any other SSRI therapy. Certainly some of her hormonal changes could be causing mood issues. We'll wait and she is on birth control for a few months and see if her mood improves. I did discuss with patient I do not think Prozac caused her extreme nausea. I think she was fighting a urinary tract infection and it was a consequence a difference.

## 2016-05-23 ENCOUNTER — Encounter: Payer: Self-pay | Admitting: Physician Assistant

## 2016-06-22 ENCOUNTER — Telehealth: Payer: Self-pay | Admitting: Family Medicine

## 2016-06-22 NOTE — Telephone Encounter (Signed)
Pt was recently [facility] the [facility]. She has been on the pill for 1 week. States she has been having headaches daily. Some last a couple minutes, some last hours. Pt has been trying to cut back on her smoking. She is also still cramping a lot. Will route to Provider to see if she should continue the pill or if a change is appropriate.

## 2016-06-22 NOTE — Telephone Encounter (Signed)
I would encourage her to give it one more week. We can change to pill though fi she would like.

## 2016-06-23 NOTE — Telephone Encounter (Signed)
Pt advised of recommendation, verbalized understanding. No further questions.  

## 2016-11-20 ENCOUNTER — Ambulatory Visit (INDEPENDENT_AMBULATORY_CARE_PROVIDER_SITE_OTHER): Payer: Self-pay | Admitting: Sports Medicine

## 2016-11-20 ENCOUNTER — Ambulatory Visit (INDEPENDENT_AMBULATORY_CARE_PROVIDER_SITE_OTHER): Payer: Self-pay

## 2016-11-20 ENCOUNTER — Encounter: Payer: Self-pay | Admitting: Sports Medicine

## 2016-11-20 DIAGNOSIS — M5136 Other intervertebral disc degeneration, lumbar region: Secondary | ICD-10-CM

## 2016-11-20 DIAGNOSIS — M5489 Other dorsalgia: Secondary | ICD-10-CM

## 2016-11-20 DIAGNOSIS — M51369 Other intervertebral disc degeneration, lumbar region without mention of lumbar back pain or lower extremity pain: Secondary | ICD-10-CM | POA: Insufficient documentation

## 2016-11-20 MED ORDER — METHYLPREDNISOLONE SODIUM SUCC 125 MG IJ SOLR
125.0000 mg | Freq: Once | INTRAMUSCULAR | Status: AC
Start: 2016-11-20 — End: 2016-11-20
  Administered 2016-11-20: 125 mg via INTRAMUSCULAR

## 2016-11-20 MED ORDER — PREDNISONE 50 MG PO TABS: ORAL_TABLET | ORAL | 0 refills | Status: DC

## 2016-11-20 MED ORDER — MELOXICAM 15 MG PO TABS: ORAL_TABLET | ORAL | 3 refills | Status: DC

## 2016-11-20 MED ORDER — KETOROLAC TROMETHAMINE 30 MG/ML IJ SOLN
30.0000 mg | Freq: Once | INTRAMUSCULAR | Status: AC
  Administered 2016-11-20: 30 mg via INTRAMUSCULAR

## 2016-11-20 NOTE — Addendum Note (Signed)
Addended by: Baird KayUGLAS, Jalesia Loudenslager M on: 11/20/2016 11:51 AM   Modules accepted: Orders

## 2016-11-20 NOTE — Assessment & Plan Note (Signed)
Present now for several months. Toradol 30, Solu-Medrol 125. Prednisone, meloxicam, x-rays, she has had pain long enough and we will consider an MRI. Home exercises given. Return to see me in one month.

## 2016-11-20 NOTE — Progress Notes (Signed)
   Subjective:    I'm seeing this patient as a consultation for:  Alicia GawJade Breeback, PA-C  CC: Low back pain  HPI: This is a pleasant 26 year old female, she has a strong family history of lumbar degenerative disc disease. For the past day she's had severe low back pain, right-sided, worse with sitting, flexion, Valsalva without bowel or bladder dysfunction or radicular symptoms. She has been given physician directed rehabilitation exercises in the past, without much improvement. On further questioning she's had flares of this type back pain for years, including one necessitating a trip to the emergency department. No constitutional symptoms, no trauma. Agreeable to proceed with aggressive treatment and imaging.  Past medical history:  Negative.  See flowsheet/record as well for more information.  Surgical history: Negative.  See flowsheet/record as well for more information.  Family history: Negative.  See flowsheet/record as well for more information.  Social history: Negative.  See flowsheet/record as well for more information.  Allergies, and medications have been entered into the medical record, reviewed, and no changes needed.   Review of Systems: No headache, visual changes, nausea, vomiting, diarrhea, constipation, dizziness, abdominal pain, skin rash, fevers, chills, night sweats, weight loss, swollen lymph nodes, body aches, joint swelling, muscle aches, chest pain, shortness of breath, mood changes, visual or auditory hallucinations.   Objective:   General: Well Developed, well nourished, and in no acute distress.  Neuro/Psych: Alert and oriented x3, extra-ocular muscles intact, able to move all 4 extremities, sensation grossly intact. Skin: Warm and dry, no rashes noted.  Respiratory: Not using accessory muscles, speaking in full sentences, trachea midline.  Cardiovascular: Pulses palpable, no extremity edema. Abdomen: Does not appear distended. Back Exam:  Inspection: Unremarkable    Motion: Flexion 45 deg, Extension 45 deg, Side Bending to 45 deg bilaterally,  Rotation to 45 deg bilaterally  SLR laying: Positive with reproduction of back pain but not radicular pain  XSLR laying: Negative  Palpable tenderness: Left and right quadratus lumborum. FABER: negative. Sensory change: Gross sensation intact to all lumbar and sacral dermatomes.  Reflexes: 2+ at both patellar tendons, 2+ at achilles tendons, Babinski's downgoing.  Strength at foot  Plantar-flexion: 5/5 Dorsi-flexion: 5/5 Eversion: 5/5 Inversion: 5/5  Leg strength  Quad: 5/5 Hamstring: 5/5 Hip flexor: 5/5 Hip abductors: 5/5  Gait unremarkable.  Toradol 30 mg and Solu-Medrol 125 mg given intramuscular.  Impression and Recommendations:   This case required medical decision making of moderate complexity.  Lumbar degenerative disc disease Present now for several months. Toradol 30, Solu-Medrol 125. Prednisone, meloxicam, x-rays, she has had pain long enough and we will consider an MRI. Home exercises given. Return to see me in one month.

## 2016-11-21 ENCOUNTER — Telehealth: Payer: Self-pay

## 2016-11-21 NOTE — Telephone Encounter (Signed)
Pt left VM asking if it's ok for her to take prednisone while on birth control pills, states she read a contraindication and she stopped taking it. Would also like to know if she could get a change to medications prescribed because she had a severe headache after taking them. Please advise.

## 2016-11-22 NOTE — Telephone Encounter (Signed)
Pt states her headache was not caused by any medications that you prescribed. Pt stated she thinks it was caused by the shot she was given. Pt informed about prednisone. Pt had understanding and was agreeable.

## 2016-11-22 NOTE — Telephone Encounter (Signed)
OK thanks, no changes needed.

## 2016-11-22 NOTE — Telephone Encounter (Signed)
Yes, prednisone is safe to take while on birth control. Which medication gave her headache?

## 2016-11-27 ENCOUNTER — Other Ambulatory Visit: Payer: Self-pay

## 2016-12-18 ENCOUNTER — Ambulatory Visit: Payer: Self-pay | Admitting: Sports Medicine

## 2016-12-20 ENCOUNTER — Encounter: Payer: Self-pay | Admitting: Physician Assistant

## 2017-05-30 ENCOUNTER — Other Ambulatory Visit: Payer: Self-pay | Admitting: Family Medicine

## 2018-04-29 ENCOUNTER — Other Ambulatory Visit: Payer: Self-pay | Admitting: Family Medicine

## 2018-05-14 ENCOUNTER — Other Ambulatory Visit: Payer: Self-pay | Admitting: *Deleted

## 2018-05-14 ENCOUNTER — Other Ambulatory Visit: Payer: Self-pay | Admitting: Family Medicine

## 2018-05-31 IMAGING — DX DG LUMBAR SPINE COMPLETE 4+V
5 series · 5 of 5 positions shown · non-contrast
Comparison: None.

CLINICAL DATA: Right-sided back pain for 2 weeks.

EXAM:
LUMBAR SPINE - COMPLETE 4+ VIEW

[l-spine ap]
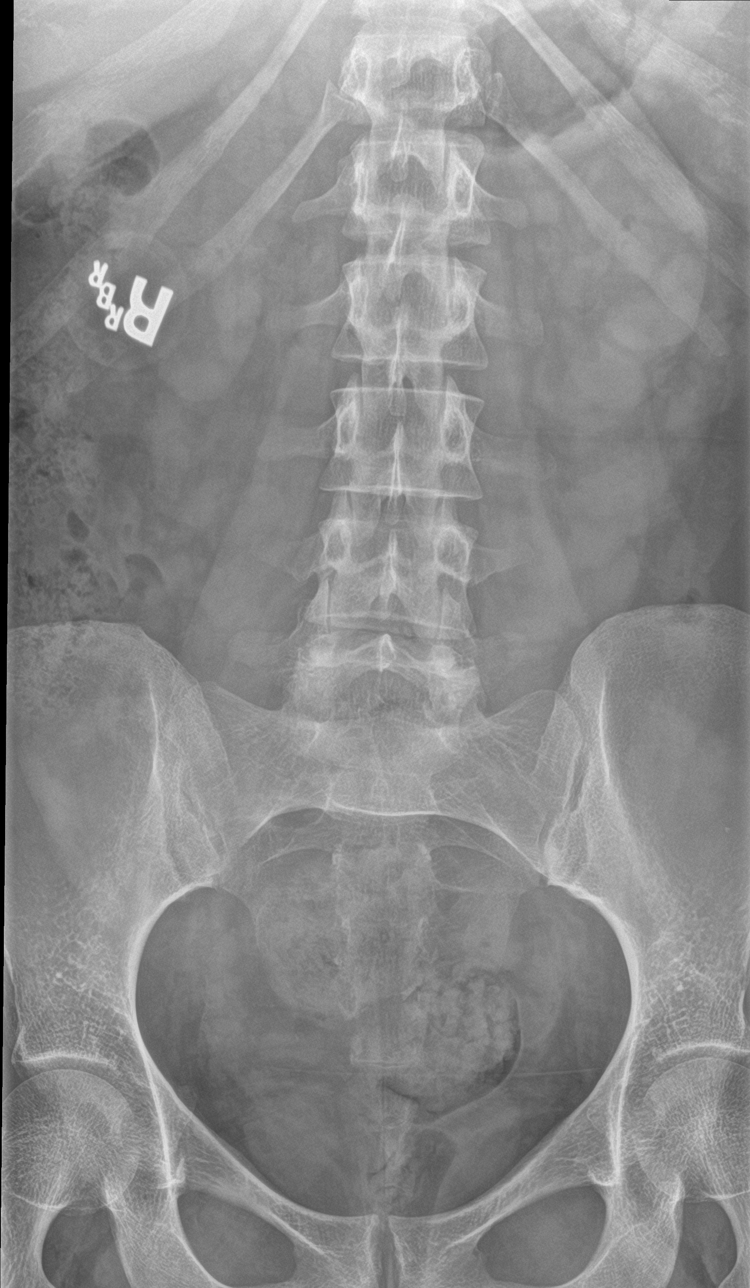

[l-spine obl (1 of 2)]
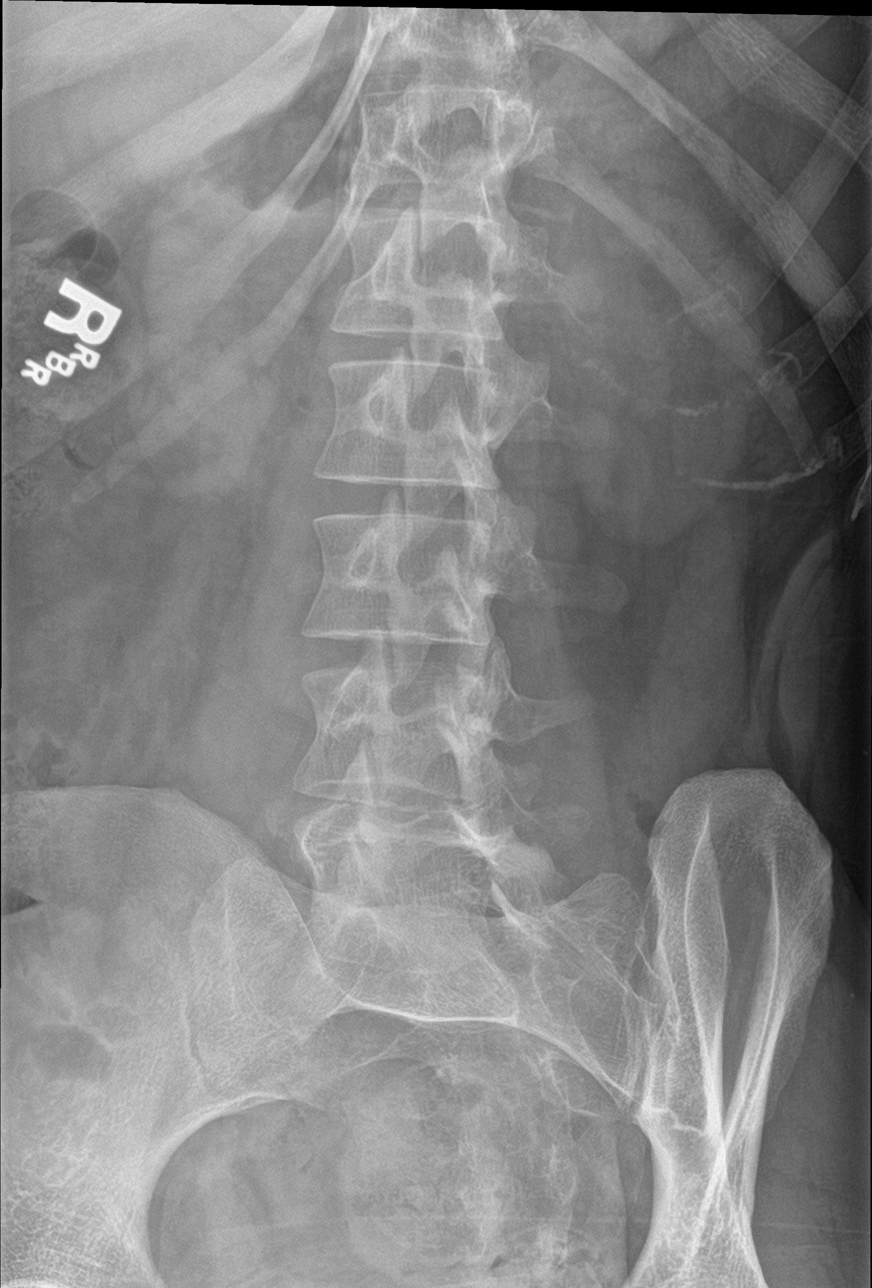

[l-spine obl (2 of 2)]
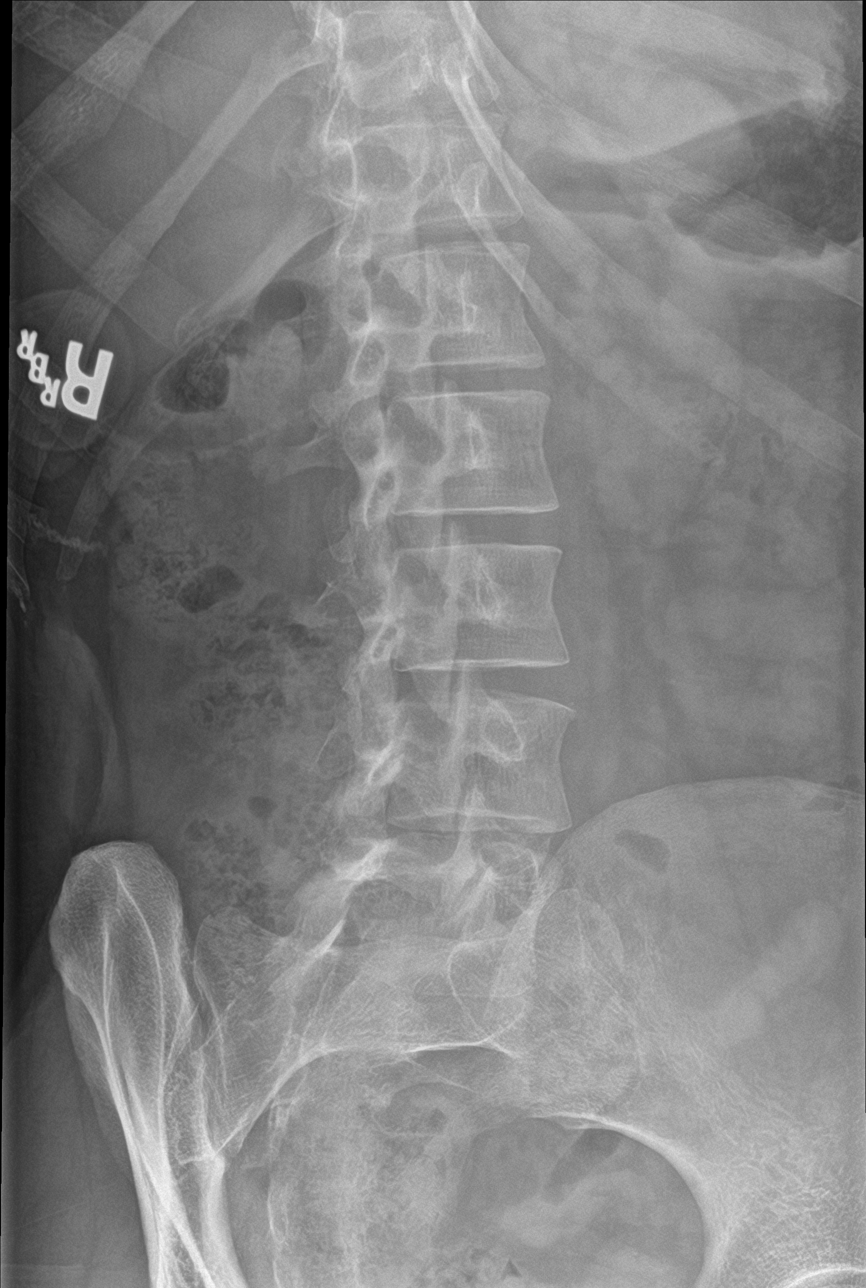

[l-spine lat]
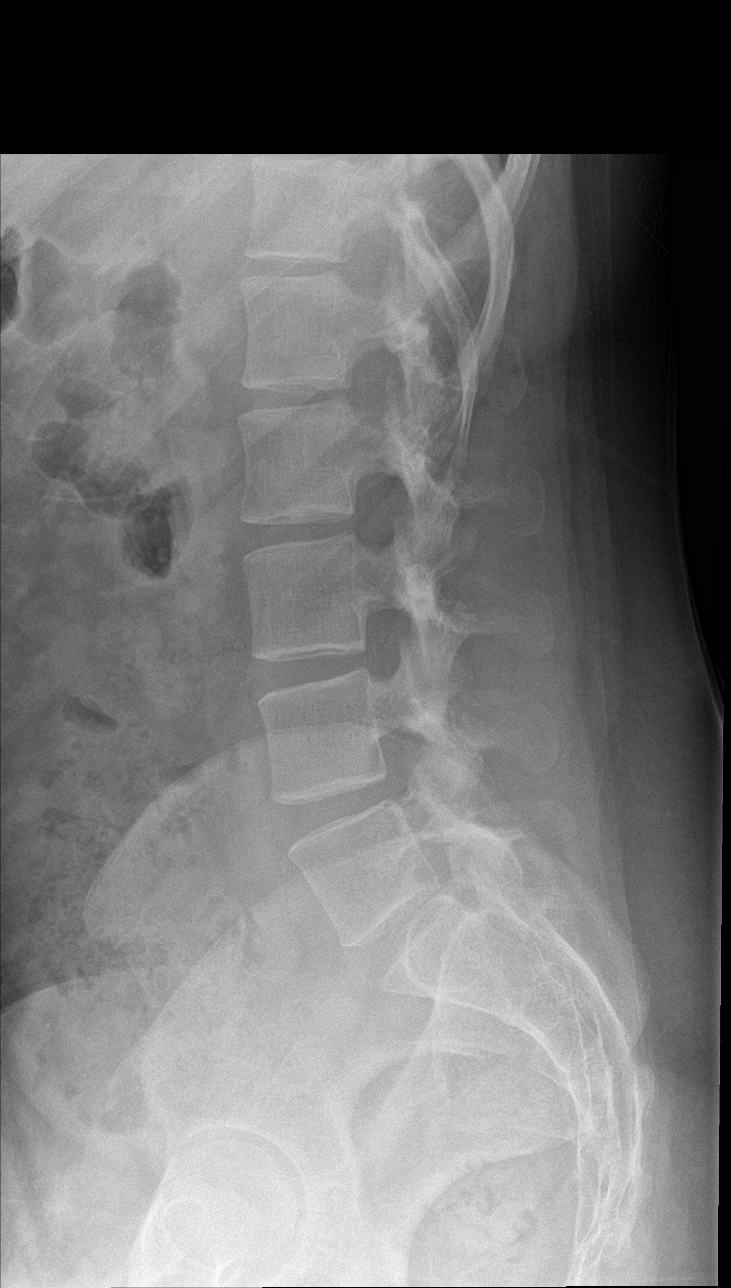

[l-spine spot]
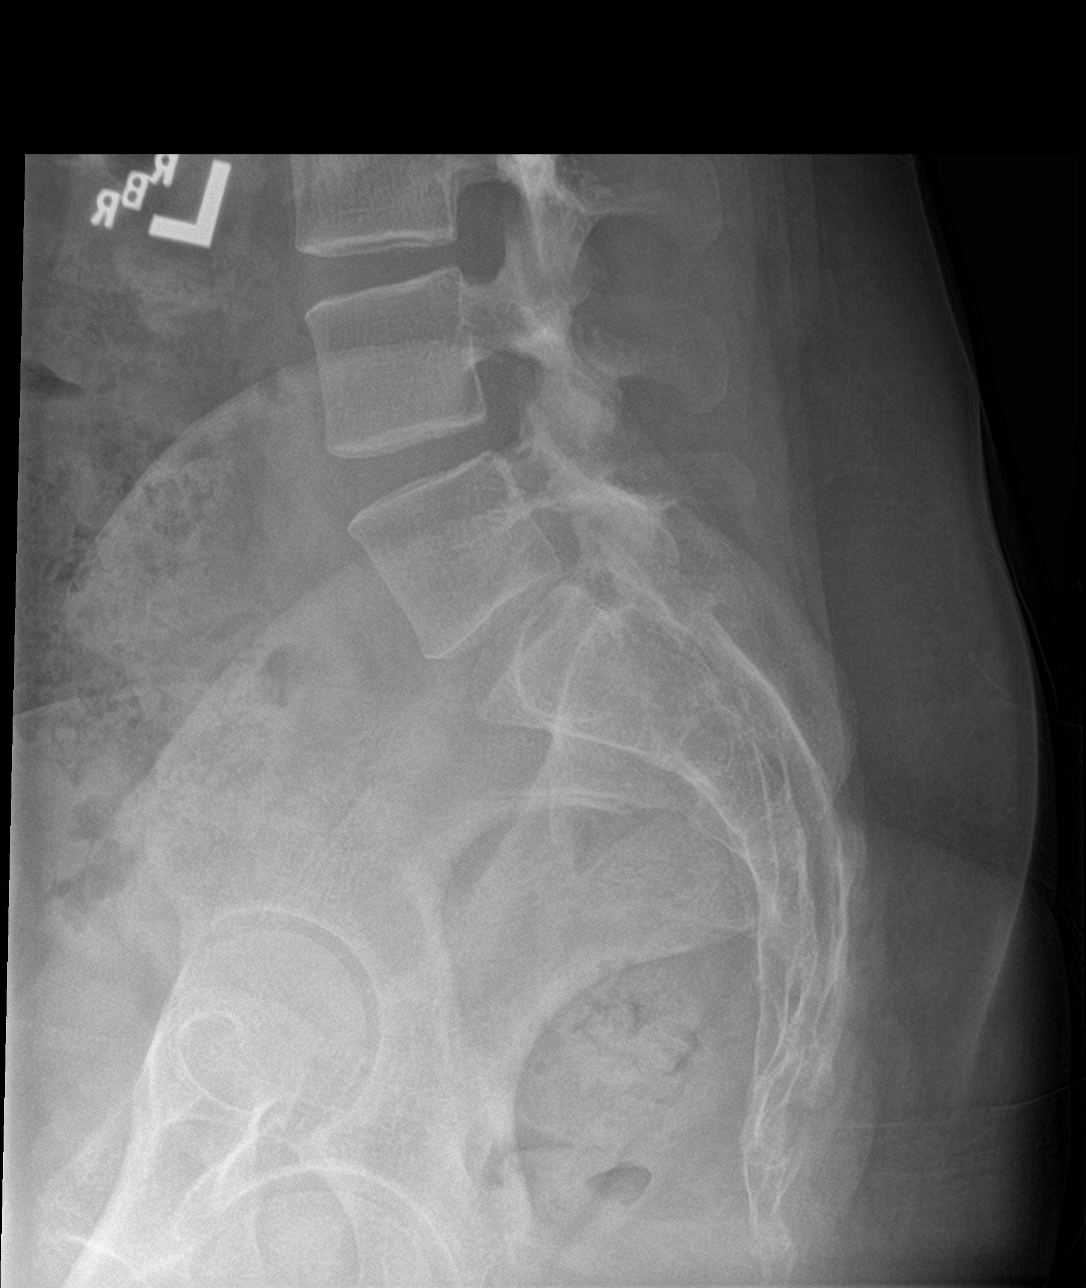

[5 of 5 positions shown; findings below may reference images not displayed]

FINDINGS: There is no evidence of lumbar spine fracture. Alignment is normal.
Intervertebral disc spaces are maintained.
IMPRESSION: Normal lumbar spine.

## 2018-06-05 ENCOUNTER — Other Ambulatory Visit: Payer: Self-pay | Admitting: Physician Assistant

## 2018-06-30 ENCOUNTER — Other Ambulatory Visit: Payer: Self-pay | Admitting: Physician Assistant

## 2020-09-22 ENCOUNTER — Telehealth (INDEPENDENT_AMBULATORY_CARE_PROVIDER_SITE_OTHER): Payer: Self-pay | Admitting: Osteopathic Medicine

## 2020-09-22 ENCOUNTER — Encounter: Payer: Self-pay | Admitting: Osteopathic Medicine

## 2020-09-22 VITALS — Temp 99.1°F | Ht 66.0 in | Wt 162.0 lb

## 2020-09-22 DIAGNOSIS — Z20822 Contact with and (suspected) exposure to covid-19: Secondary | ICD-10-CM

## 2020-09-22 NOTE — Progress Notes (Signed)
Telemedicine Visit via  Video & Audio (App used: MyChart) Audio only - telephone (patient preference /   I connected with Alicia Williams on 09/22/20 at 4:39 PM  by phone or  telemedicine application as noted above  I verified that I am speaking with or regarding  the correct patient using two identifiers.  Participants: Myself, Dr Sunnie Nielsen DO Patient: Alicia Williams Patient proxy if applicable: none Other, if applicable: none  Patient is at home I am in office at [facility]    I discussed the limitations of evaluation and management  by telemedicine and the availability of in person appointments.  The participant(s) above expressed understanding and  agreed to proceed with this appointment via telemedicine.       History of Present Illness: Alicia Williams is a 29 y.o. female who would like to discuss feeling sick    Started feeling sick Monday w/ headache Later that night started w/ chills and vomiting x1 Took at-home COVID test and it was negative  Fever and headache No more nausea symptoms  No vaccine for COVID or for flu   Was at stepmother's for Xmas  Mom and stepdad staying w/ her  Lives w/ fiancee     Observations/Objective: Temp 99.1 F (37.3 C)   Ht 5\' 6"  (1.676 m)   Wt 162 lb (73.5 kg)   BMI 26.15 kg/m  BP Readings from Last 3 Encounters:  11/20/16 125/86  05/10/16 117/66  05/04/16 (!) 147/82   Exam: Normal Speech.  NAD  Lab and Radiology Results No results found for this or any previous visit (from the past 72 hour(s)). No results found.     Assessment and Plan: 29 y.o. female with The encounter diagnosis was Suspected COVID-19 virus infection.  Strict isolation advised Strong suspicion for COVID or influenza - on schedule to get tested tomorrow  Relatively mild symptoms, can treat OTC at least for now     Patient Instructions  Medications & Home Remedies for Upper Respiratory Illness   Note: the following  list assumes no pregnancy, normal liver & kidney function and no other drug interactions. Dr. 38 has highlighted medications which are safe for you to use, but these may not be appropriate for everyone. Always ask a pharmacist or qualified medical provider if you have any questions!    Aches/Pains, Fever, Headache OTC Acetaminophen (Tylenol) 500 mg tablets - take max 2 tablets (1000 mg) every 6 hours (4 times per day)  OTC Ibuprofen (Motrin) 200 mg tablets - take max 4 tablets (800 mg) every 6 hours*   Sinus Congestion OTC Nasal Saline if desired to rinse OTC Oxymetolazone (Afrin, others) sparing use due to rebound congestion, NEVER use in kids OTC Phenylephrine (Sudafed) 10 mg tablets every 4 hours (or the 12-hour formulation)* OTC Diphenhydramine (Benadryl) 25 mg tablets - take max 2 tablets every 4 hours   Cough & Sore Throat Prescription cough pills or syrups as directed OTC Dextromethorphan (Robitussin, others) - cough suppressant OTC Guaifenesin (Robitussin, Mucinex, others) - expectorant (helps cough up mucus) (Dextromethorphan and Guaifenesin also come in a combination tablet/syrup) OTC Lozenges w/ Benzocaine + Menthol (Cepacol) Honey - as much as you want! Teas which "coat the throat" - look for ingredients Elm Bark, Licorice Root, Marshmallow Root   Other Prescription Oral Steroids to decrease inflammation and improve energy Prescription Antibiotics if these are necessary for bacterial infection - take ALL, even if you're feeling better  OTC Zinc Lozenges within 24  hours of symptoms onset - mixed evidence this shortens the duration of the common cold Don't waste your money on Vitamin C or Echinacea in acute illness - it's already too late!    *Caution in patients with high blood pressure      Instructions sent via MyChart.   Follow Up Instructions: Return for RECHECK PENDING RESULTS / IF WORSE OR CHANGE.    I discussed the assessment and treatment plan with the  patient. The patient was provided an opportunity to ask questions and all were answered. The patient agreed with the plan and demonstrated an understanding of the instructions.   The patient was advised to call back or seek an in-person evaluation if any new concerns, if symptoms worsen or if the condition fails to improve as anticipated.  20 minutes of non-face-to-face time was provided during this encounter.      . . . . . . . . . . . . . Marland Kitchen                   Historical information moved to improve visibility of documentation.  Past Medical History:  Diagnosis Date  . OI (osteogenesis imperfecta)    Past Surgical History:  Procedure Laterality Date  . ANKLE SURGERY     RIGHT  . MANDIBLE SURGERY     Social History   Tobacco Use  . Smoking status: Current Every Day Smoker    Packs/day: 0.50    Years: 18.00    Pack years: 9.00    Types: Cigarettes  . Smokeless tobacco: Never Used  Substance Use Topics  . Alcohol use: No    Comment: occ   family history includes Cancer in her brother, father, mother, and paternal grandmother; Heart attack in her paternal grandmother; Hypertension in her father, paternal grandfather, and paternal grandmother; Stroke in her paternal grandmother.  Medications: Current Outpatient Medications  Medication Sig Dispense Refill  . JUNEL FE 1/20 1-20 MG-MCG tablet TAKE 1 TABLET BY MOUTH DAILY. 30 DAY SUPPLY GIVEN MUST SCHEDULE AND KEEP APPOINTMENT FOR REFILLS (Patient not taking: Reported on 09/22/2020) 28 tablet 0  . meloxicam (MOBIC) 15 MG tablet One tab PO qAM with breakfast for 2 weeks, then daily prn pain. (Patient not taking: Reported on 09/22/2020) 30 tablet 3   No current facility-administered medications for this visit.   No Known Allergies

## 2020-09-22 NOTE — Patient Instructions (Addendum)
Medications & Home Remedies for Upper Respiratory Illness   Note: the following list assumes no pregnancy, normal liver & kidney function and no other drug interactions. Dr. Danarius Mcconathy has highlighted medications which are safe for you to use, but these may not be appropriate for everyone. Always ask a pharmacist or qualified medical provider if you have any questions!    Aches/Pains, Fever, Headache OTC Acetaminophen (Tylenol) 500 mg tablets - take max 2 tablets (1000 mg) every 6 hours (4 times per day)  OTC Ibuprofen (Motrin) 200 mg tablets - take max 4 tablets (800 mg) every 6 hours*   Sinus Congestion OTC Nasal Saline if desired to rinse OTC Oxymetolazone (Afrin, others) sparing use due to rebound congestion, NEVER use in kids OTC Phenylephrine (Sudafed) 10 mg tablets every 4 hours (or the 12-hour formulation)* OTC Diphenhydramine (Benadryl) 25 mg tablets - take max 2 tablets every 4 hours   Cough & Sore Throat Prescription cough pills or syrups as directed OTC Dextromethorphan (Robitussin, others) - cough suppressant OTC Guaifenesin (Robitussin, Mucinex, others) - expectorant (helps cough up mucus) (Dextromethorphan and Guaifenesin also come in a combination tablet/syrup) OTC Lozenges w/ Benzocaine + Menthol (Cepacol) Honey - as much as you want! Teas which "coat the throat" - look for ingredients Elm Bark, Licorice Root, Marshmallow Root   Other Prescription Oral Steroids to decrease inflammation and improve energy Prescription Antibiotics if these are necessary for bacterial infection - take ALL, even if you're feeling better  OTC Zinc Lozenges within 24 hours of symptoms onset - mixed evidence this shortens the duration of the common cold Don't waste your money on Vitamin C or Echinacea in acute illness - it's already too late!    *Caution in patients with high blood pressure    

## 2020-09-23 ENCOUNTER — Other Ambulatory Visit: Payer: Self-pay

## 2020-09-23 ENCOUNTER — Ambulatory Visit (INDEPENDENT_AMBULATORY_CARE_PROVIDER_SITE_OTHER): Payer: Self-pay | Admitting: Family Medicine

## 2020-09-23 DIAGNOSIS — R059 Cough, unspecified: Secondary | ICD-10-CM

## 2020-09-23 DIAGNOSIS — Z20822 Contact with and (suspected) exposure to covid-19: Secondary | ICD-10-CM

## 2020-09-23 LAB — POCT INFLUENZA A/B
Influenza A, POC: NEGATIVE
Influenza B, POC: NEGATIVE

## 2020-09-23 NOTE — Progress Notes (Signed)
Patient presented for FLU & Covid swab testing. Patient tolerated nasal swabs well without any complications. Patient informed to follow up with provider as instructed.

## 2020-09-23 NOTE — Progress Notes (Signed)
Agree with documentation as above.   Esperanza Madrazo, MD  

## 2020-09-26 LAB — SARS-COV-2, NAA 2 DAY TAT

## 2020-09-26 LAB — NOVEL CORONAVIRUS, NAA: SARS-CoV-2, NAA: DETECTED — AB

## 2022-02-06 ENCOUNTER — Ambulatory Visit: Payer: Self-pay | Admitting: Physician Assistant

## 2022-02-06 DIAGNOSIS — R5383 Other fatigue: Secondary | ICD-10-CM

## 2022-02-06 DIAGNOSIS — Z1329 Encounter for screening for other suspected endocrine disorder: Secondary | ICD-10-CM

## 2022-02-06 DIAGNOSIS — Z131 Encounter for screening for diabetes mellitus: Secondary | ICD-10-CM

## 2022-02-06 DIAGNOSIS — Z13220 Encounter for screening for lipoid disorders: Secondary | ICD-10-CM

## 2023-06-28 ENCOUNTER — Ambulatory Visit: Payer: Self-pay | Admitting: Family Medicine

## 2023-06-28 ENCOUNTER — Encounter: Payer: Self-pay | Admitting: Family Medicine

## 2023-06-28 VITALS — BP 119/88 | HR 76 | Ht 66.0 in | Wt 165.8 lb

## 2023-06-28 DIAGNOSIS — Z0000 Encounter for general adult medical examination without abnormal findings: Secondary | ICD-10-CM | POA: Insufficient documentation

## 2023-06-28 NOTE — Progress Notes (Signed)
Established patient visit   Patient: Alicia Williams   DOB: 1991-08-18   32 y.o. Female  MRN: [id number] Visit Date: 06/28/2023  Today's healthcare provider: Charlton Amor, DO   Chief Complaint  Patient presents with   New Patient (Initial Visit)    Establish care    SUBJECTIVE    Chief Complaint  Patient presents with   New Patient (Initial Visit)    Establish care   HPI HPI     New Patient (Initial Visit)    Additional comments: Establish care      Last edited by Roselyn Reef, CMA on 06/28/2023  1:59 PM.      Pt presents for wellness visit. Doing well with no concerns.    Review of Systems  Constitutional:  Negative for activity change, fatigue and fever.  Respiratory:  Negative for cough and shortness of breath.   Cardiovascular:  Negative for chest pain.  Gastrointestinal:  Negative for abdominal pain.  Genitourinary:  Negative for difficulty urinating.       No outpatient medications have been marked as taking for the 06/28/23 encounter (Office Visit) with Charlton Amor, DO.    OBJECTIVE    BP 119/88 (BP Location: Left Arm, Patient Position: Sitting, Cuff Size: Normal)   Pulse 76   Ht 5\' 6"  (1.676 m)   Wt 165 lb 12 oz (75.2 kg)   SpO2 100%   BMI 26.75 kg/m   Physical Exam Vitals and nursing note reviewed.  Constitutional:      General: She is not in acute distress.    Appearance: Normal appearance.  HENT:     Head: Normocephalic and atraumatic.     Right Ear: External ear normal.     Left Ear: External ear normal.     Nose: Nose normal.  Eyes:     Conjunctiva/sclera: Conjunctivae normal.  Cardiovascular:     Rate and Rhythm: Normal rate and regular rhythm.  Pulmonary:     Effort: Pulmonary effort is normal.     Breath sounds: Normal breath sounds.  Neurological:     General: No focal deficit present.     Mental Status: She is alert and oriented to person, place, and time.  Psychiatric:        Mood and Affect: Mood normal.         Behavior: Behavior normal.        Thought Content: Thought content normal.        Judgment: Judgment normal.        ASSESSMENT & PLAN    Problem List Items Addressed This Visit       Other   Routine adult health maintenance - Primary   Relevant Orders   CBC   Lipid panel   TSH + free T4   CMP14+EGFR    Pt doing well has hx of PCOS. Has had an abnormal period but we decided it would be best to wait for a few months to see if her periods level out prior to starting birth control   No follow-ups on file.      No orders of the defined types were placed in this encounter.   Orders Placed This Encounter  Procedures   CBC   Lipid panel    Order Specific Question:   Has the patient fasted?    Answer:   No    Order Specific Question:   Release to patient    Answer:   Immediate  TSH + free T4   CMP14+EGFR    Order Specific Question:   Has the patient fasted?    Answer:   No     Charlton Amor, DO  [facility] at [facility] [phone] (phone) [phone] (fax)  [facility]

## 2023-06-29 ENCOUNTER — Encounter: Payer: Self-pay | Admitting: Family Medicine

## 2023-06-29 LAB — TSH+FREE T4
Free T4: 1 ng/dL (ref 0.82–1.77)
TSH: 1.23 u[IU]/mL (ref 0.450–4.500)

## 2023-06-29 LAB — LIPID PANEL
Chol/HDL Ratio: 5.4 {ratio} — ABNORMAL HIGH (ref 0.0–4.4)
Cholesterol, Total: 184 mg/dL (ref 100–199)
HDL: 34 mg/dL — ABNORMAL LOW (ref >39)
LDL Chol Calc (NIH): 103 mg/dL — ABNORMAL HIGH (ref 0–99)
Triglycerides: 276 mg/dL — ABNORMAL HIGH (ref 0–149)
VLDL Cholesterol Cal: 47 mg/dL — ABNORMAL HIGH (ref 5–40)

## 2023-06-29 LAB — CMP14+EGFR
ALT: 37 [IU]/L — ABNORMAL HIGH (ref 0–32)
AST: 19 [IU]/L (ref 0–40)
Albumin: 4.7 g/dL (ref 3.9–4.9)
Alkaline Phosphatase: 79 [IU]/L (ref 44–121)
BUN/Creatinine Ratio: 17 (ref 9–23)
BUN: 11 mg/dL (ref 6–20)
Bilirubin Total: 0.3 mg/dL (ref 0.0–1.2)
CO2: 23 mmol/L (ref 20–29)
Calcium: 9.9 mg/dL (ref 8.7–10.2)
Chloride: 103 mmol/L (ref 96–106)
Creatinine, Ser: 0.66 mg/dL (ref 0.57–1.00)
Globulin, Total: 2.4 g/dL (ref 1.5–4.5)
Glucose: 79 mg/dL (ref 70–99)
Potassium: 4.5 mmol/L (ref 3.5–5.2)
Sodium: 139 mmol/L (ref 134–144)
Total Protein: 7.1 g/dL (ref 6.0–8.5)
eGFR: 118 mL/min/{1.73_m2} (ref >59)

## 2023-06-29 LAB — CBC
Hematocrit: 46.6 % (ref 34.0–46.6)
Hemoglobin: 15.8 g/dL (ref 11.1–15.9)
MCH: 29.9 pg (ref 26.6–33.0)
MCHC: 33.9 g/dL (ref 31.5–35.7)
MCV: 88 fL (ref 79–97)
Platelets: 384 10*3/uL (ref 150–450)
RBC: 5.28 x10E6/uL (ref 3.77–5.28)
RDW: 13.2 % (ref 11.7–15.4)
WBC: 6.4 10*3/uL (ref 3.4–10.8)
